# Patient Record
Sex: Female | Born: 1970 | Smoking: Current every day smoker
Health system: Southern US, Community
[De-identification: ages and names within clinical notes are randomized; demographics above are authoritative.]

## PROBLEM LIST (undated history)

## (undated) DIAGNOSIS — C801 Malignant (primary) neoplasm, unspecified: Secondary | ICD-10-CM

## (undated) DIAGNOSIS — R011 Cardiac murmur, unspecified: Secondary | ICD-10-CM

## (undated) HISTORY — PX: BREAST ENHANCEMENT SURGERY: SHX7

---

## 2010-08-16 ENCOUNTER — Other Ambulatory Visit (HOSPITAL_COMMUNITY)
Admission: RE | Admit: 2010-08-16 | Discharge: 2010-08-16 | Disposition: A | Discharge status: Home / Self Care | Payer: BC Managed Care – PPO | Source: Ambulatory Visit | Attending: Family Medicine | Admitting: Family Medicine

## 2010-08-16 DIAGNOSIS — Z1159 Encounter for screening for other viral diseases: Secondary | ICD-10-CM | POA: Insufficient documentation

## 2010-08-16 DIAGNOSIS — Z124 Encounter for screening for malignant neoplasm of cervix: Secondary | ICD-10-CM | POA: Insufficient documentation

## 2012-02-17 ENCOUNTER — Ambulatory Visit (INDEPENDENT_AMBULATORY_CARE_PROVIDER_SITE_OTHER): Payer: Self-pay | Admitting: General Surgery

## 2012-03-23 ENCOUNTER — Ambulatory Visit (INDEPENDENT_AMBULATORY_CARE_PROVIDER_SITE_OTHER): Payer: BC Managed Care – PPO | Admitting: General Surgery

## 2012-03-25 ENCOUNTER — Encounter (INDEPENDENT_AMBULATORY_CARE_PROVIDER_SITE_OTHER): Payer: Self-pay | Admitting: General Surgery

## 2015-11-28 ENCOUNTER — Other Ambulatory Visit: Payer: Self-pay | Admitting: Internal Medicine

## 2015-11-28 DIAGNOSIS — N63 Unspecified lump in unspecified breast: Secondary | ICD-10-CM

## 2015-12-03 ENCOUNTER — Other Ambulatory Visit: Payer: Self-pay | Admitting: Internal Medicine

## 2015-12-03 ENCOUNTER — Other Ambulatory Visit: Payer: Self-pay

## 2015-12-03 ENCOUNTER — Ambulatory Visit
Admission: RE | Admit: 2015-12-03 | Discharge: 2015-12-03 | Disposition: A | Discharge status: Home / Self Care | Payer: 59 | Source: Ambulatory Visit | Attending: Internal Medicine | Admitting: Internal Medicine

## 2015-12-03 DIAGNOSIS — N63 Unspecified lump in unspecified breast: Secondary | ICD-10-CM

## 2015-12-04 ENCOUNTER — Other Ambulatory Visit: Payer: Self-pay | Admitting: Internal Medicine

## 2015-12-04 DIAGNOSIS — N63 Unspecified lump in unspecified breast: Secondary | ICD-10-CM

## 2015-12-05 ENCOUNTER — Ambulatory Visit
Admission: RE | Admit: 2015-12-05 | Discharge: 2015-12-05 | Disposition: A | Discharge status: Home / Self Care | Payer: 59 | Source: Ambulatory Visit | Attending: Internal Medicine | Admitting: Internal Medicine

## 2015-12-05 ENCOUNTER — Other Ambulatory Visit: Payer: Self-pay | Admitting: Internal Medicine

## 2015-12-05 DIAGNOSIS — N63 Unspecified lump in unspecified breast: Secondary | ICD-10-CM

## 2015-12-19 ENCOUNTER — Other Ambulatory Visit: Payer: Self-pay | Admitting: General Surgery

## 2015-12-19 DIAGNOSIS — C50212 Malignant neoplasm of upper-inner quadrant of left female breast: Secondary | ICD-10-CM

## 2015-12-19 DIAGNOSIS — Z170 Estrogen receptor positive status [ER+]: Principal | ICD-10-CM

## 2015-12-27 ENCOUNTER — Ambulatory Visit
Admission: RE | Admit: 2015-12-27 | Discharge: 2015-12-27 | Disposition: A | Discharge status: Home / Self Care | Payer: 59 | Source: Ambulatory Visit | Attending: General Surgery | Admitting: General Surgery

## 2015-12-27 ENCOUNTER — Encounter: Payer: Self-pay | Admitting: Radiology

## 2015-12-27 DIAGNOSIS — C50212 Malignant neoplasm of upper-inner quadrant of left female breast: Secondary | ICD-10-CM

## 2015-12-27 MED ORDER — GADOBENATE DIMEGLUMINE 529 MG/ML IV SOLN
9.0000 mL | Freq: Once | INTRAVENOUS | Status: AC | PRN
  Administered 2015-12-27: 9 mL via INTRAVENOUS

## 2015-12-31 ENCOUNTER — Other Ambulatory Visit: Payer: Self-pay | Admitting: General Surgery

## 2015-12-31 DIAGNOSIS — C50212 Malignant neoplasm of upper-inner quadrant of left female breast: Secondary | ICD-10-CM

## 2015-12-31 DIAGNOSIS — Z170 Estrogen receptor positive status [ER+]: Principal | ICD-10-CM

## 2016-01-01 ENCOUNTER — Encounter (HOSPITAL_BASED_OUTPATIENT_CLINIC_OR_DEPARTMENT_OTHER): Payer: Self-pay | Admitting: *Deleted

## 2016-01-04 ENCOUNTER — Other Ambulatory Visit: Payer: Self-pay | Admitting: General Surgery

## 2016-01-04 ENCOUNTER — Ambulatory Visit
Admission: RE | Admit: 2016-01-04 | Discharge: 2016-01-04 | Disposition: A | Discharge status: Home / Self Care | Payer: 59 | Source: Ambulatory Visit | Attending: General Surgery | Admitting: General Surgery

## 2016-01-04 DIAGNOSIS — Z170 Estrogen receptor positive status [ER+]: Principal | ICD-10-CM

## 2016-01-04 DIAGNOSIS — C50212 Malignant neoplasm of upper-inner quadrant of left female breast: Secondary | ICD-10-CM

## 2016-01-04 NOTE — Progress Notes (Signed)
Pt given 8 oz carton of boost breeze with verbal and written instructions to drink by  5 am   Morning of surgery. Teach back and pt voiced understanding

## 2016-01-08 ENCOUNTER — Ambulatory Visit
Admission: RE | Admit: 2016-01-08 | Discharge: 2016-01-08 | Disposition: A | Discharge status: Home / Self Care | Payer: 59 | Source: Ambulatory Visit | Attending: General Surgery | Admitting: General Surgery

## 2016-01-08 ENCOUNTER — Ambulatory Visit (HOSPITAL_BASED_OUTPATIENT_CLINIC_OR_DEPARTMENT_OTHER): Payer: 59 | Admitting: Certified Registered Nurse Anesthetist

## 2016-01-08 ENCOUNTER — Ambulatory Visit (HOSPITAL_BASED_OUTPATIENT_CLINIC_OR_DEPARTMENT_OTHER)
Admission: RE | Admit: 2016-01-08 | Discharge: 2016-01-08 | Disposition: A | Discharge status: Home / Self Care | Payer: 59 | Source: Ambulatory Visit | Attending: General Surgery | Admitting: General Surgery

## 2016-01-08 ENCOUNTER — Ambulatory Visit (HOSPITAL_COMMUNITY)
Admission: RE | Admit: 2016-01-08 | Discharge: 2016-01-08 | Disposition: A | Discharge status: Home / Self Care | Payer: 59 | Source: Ambulatory Visit | Attending: General Surgery | Admitting: General Surgery

## 2016-01-08 ENCOUNTER — Encounter (HOSPITAL_BASED_OUTPATIENT_CLINIC_OR_DEPARTMENT_OTHER): Payer: Self-pay | Admitting: Certified Registered Nurse Anesthetist

## 2016-01-08 ENCOUNTER — Encounter (HOSPITAL_BASED_OUTPATIENT_CLINIC_OR_DEPARTMENT_OTHER)
Admission: RE | Disposition: A | Discharge status: Home / Self Care | Payer: Self-pay | Source: Ambulatory Visit | Attending: General Surgery

## 2016-01-08 DIAGNOSIS — C50212 Malignant neoplasm of upper-inner quadrant of left female breast: Secondary | ICD-10-CM

## 2016-01-08 DIAGNOSIS — F17200 Nicotine dependence, unspecified, uncomplicated: Secondary | ICD-10-CM | POA: Insufficient documentation

## 2016-01-08 DIAGNOSIS — N6012 Diffuse cystic mastopathy of left breast: Secondary | ICD-10-CM | POA: Insufficient documentation

## 2016-01-08 DIAGNOSIS — Z170 Estrogen receptor positive status [ER+]: Principal | ICD-10-CM

## 2016-01-08 HISTORY — DX: Malignant (primary) neoplasm, unspecified: C80.1

## 2016-01-08 HISTORY — DX: Cardiac murmur, unspecified: R01.1

## 2016-01-08 HISTORY — PX: BREAST LUMPECTOMY WITH RADIOACTIVE SEED AND SENTINEL LYMPH NODE BIOPSY: SHX6550

## 2016-01-08 SURGERY — BREAST LUMPECTOMY WITH RADIOACTIVE SEED AND SENTINEL LYMPH NODE BIOPSY
Anesthesia: General | Site: Breast | Laterality: Left

## 2016-01-08 MED ORDER — CEFAZOLIN SODIUM-DEXTROSE 2-4 GM/100ML-% IV SOLN
INTRAVENOUS | Status: AC
  Filled 2016-01-08: qty 100

## 2016-01-08 MED ORDER — ACETAMINOPHEN 500 MG PO TABS
1000.0000 mg | ORAL_TABLET | ORAL | Status: AC
  Administered 2016-01-08: 1000 mg via ORAL

## 2016-01-08 MED ORDER — FENTANYL CITRATE (PF) 100 MCG/2ML IJ SOLN: 25.0000 ug | INTRAMUSCULAR | Status: DC | PRN

## 2016-01-08 MED ORDER — PROPOFOL 10 MG/ML IV BOLUS
INTRAVENOUS | Status: DC | PRN
  Administered 2016-01-08: 150 mg via INTRAVENOUS

## 2016-01-08 MED ORDER — LIDOCAINE HCL (CARDIAC) 20 MG/ML IV SOLN
INTRAVENOUS | Status: DC | PRN
  Administered 2016-01-08: 30 mg via INTRAVENOUS

## 2016-01-08 MED ORDER — KETOROLAC TROMETHAMINE 30 MG/ML IJ SOLN
INTRAMUSCULAR | Status: DC | PRN
  Administered 2016-01-08: 30 mg via INTRAVENOUS

## 2016-01-08 MED ORDER — ONDANSETRON HCL 4 MG/2ML IJ SOLN
INTRAMUSCULAR | Status: DC | PRN
  Administered 2016-01-08: 4 mg via INTRAVENOUS

## 2016-01-08 MED ORDER — CELECOXIB 200 MG PO CAPS
ORAL_CAPSULE | ORAL | Status: AC
  Filled 2016-01-08: qty 1

## 2016-01-08 MED ORDER — KETOROLAC TROMETHAMINE 30 MG/ML IJ SOLN
INTRAMUSCULAR | Status: AC
  Filled 2016-01-08: qty 1

## 2016-01-08 MED ORDER — DEXAMETHASONE SODIUM PHOSPHATE 10 MG/ML IJ SOLN
INTRAMUSCULAR | Status: DC | PRN
  Administered 2016-01-08: 4 mg via INTRAVENOUS

## 2016-01-08 MED ORDER — MIDAZOLAM HCL 2 MG/2ML IJ SOLN
INTRAMUSCULAR | Status: AC
  Filled 2016-01-08: qty 2

## 2016-01-08 MED ORDER — TECHNETIUM TC 99M SULFUR COLLOID FILTERED
1.0000 | Freq: Once | INTRAVENOUS | Status: AC | PRN
  Administered 2016-01-08: 1 via INTRADERMAL

## 2016-01-08 MED ORDER — GABAPENTIN 300 MG PO CAPS
ORAL_CAPSULE | ORAL | Status: AC
  Filled 2016-01-08: qty 1

## 2016-01-08 MED ORDER — ACETAMINOPHEN 500 MG PO TABS
ORAL_TABLET | ORAL | Status: AC
  Filled 2016-01-08: qty 2

## 2016-01-08 MED ORDER — SODIUM CHLORIDE 0.9 % IJ SOLN
INTRAMUSCULAR | Status: AC
  Filled 2016-01-08: qty 10

## 2016-01-08 MED ORDER — FENTANYL CITRATE (PF) 100 MCG/2ML IJ SOLN
INTRAMUSCULAR | Status: AC
  Filled 2016-01-08: qty 2

## 2016-01-08 MED ORDER — CELECOXIB 200 MG PO CAPS
200.0000 mg | ORAL_CAPSULE | ORAL | Status: AC
  Administered 2016-01-08: 200 mg via ORAL

## 2016-01-08 MED ORDER — DEXAMETHASONE SODIUM PHOSPHATE 10 MG/ML IJ SOLN
INTRAMUSCULAR | Status: AC
  Filled 2016-01-08: qty 1

## 2016-01-08 MED ORDER — SCOPOLAMINE 1 MG/3DAYS TD PT72: 1.0000 | MEDICATED_PATCH | Freq: Once | TRANSDERMAL | Status: DC | PRN

## 2016-01-08 MED ORDER — METHYLENE BLUE 0.5 % INJ SOLN
INTRAVENOUS | Status: AC
  Filled 2016-01-08: qty 10

## 2016-01-08 MED ORDER — BUPIVACAINE HCL (PF) 0.25 % IJ SOLN
INTRAMUSCULAR | Status: AC
  Filled 2016-01-08: qty 30

## 2016-01-08 MED ORDER — EPHEDRINE SULFATE 50 MG/ML IJ SOLN
INTRAMUSCULAR | Status: DC | PRN
  Administered 2016-01-08: 10 mg via INTRAVENOUS

## 2016-01-08 MED ORDER — OXYCODONE-ACETAMINOPHEN 10-325 MG PO TABS: 1.0000 | ORAL_TABLET | Freq: Four times a day (QID) | ORAL | 0 refills | Status: AC | PRN

## 2016-01-08 MED ORDER — FENTANYL CITRATE (PF) 100 MCG/2ML IJ SOLN
50.0000 ug | INTRAMUSCULAR | Status: DC | PRN
  Administered 2016-01-08: 25 ug via INTRAVENOUS
  Administered 2016-01-08: 100 ug via INTRAVENOUS

## 2016-01-08 MED ORDER — LACTATED RINGERS IV SOLN
INTRAVENOUS | Status: DC
  Administered 2016-01-08 (×4): via INTRAVENOUS

## 2016-01-08 MED ORDER — PROPOFOL 10 MG/ML IV BOLUS
INTRAVENOUS | Status: AC
  Filled 2016-01-08: qty 20

## 2016-01-08 MED ORDER — LIDOCAINE 2% (20 MG/ML) 5 ML SYRINGE
INTRAMUSCULAR | Status: AC
  Filled 2016-01-08: qty 5

## 2016-01-08 MED ORDER — ONDANSETRON HCL 4 MG/2ML IJ SOLN
INTRAMUSCULAR | Status: AC
  Filled 2016-01-08: qty 2

## 2016-01-08 MED ORDER — BUPIVACAINE HCL (PF) 0.25 % IJ SOLN
INTRAMUSCULAR | Status: DC | PRN
  Administered 2016-01-08: 12 mL

## 2016-01-08 MED ORDER — GABAPENTIN 300 MG PO CAPS
300.0000 mg | ORAL_CAPSULE | ORAL | Status: AC
  Administered 2016-01-08: 300 mg via ORAL

## 2016-01-08 MED ORDER — MIDAZOLAM HCL 2 MG/2ML IJ SOLN
1.0000 mg | INTRAMUSCULAR | Status: DC | PRN
  Administered 2016-01-08: 2 mg via INTRAVENOUS

## 2016-01-08 MED ORDER — CEFAZOLIN SODIUM-DEXTROSE 2-4 GM/100ML-% IV SOLN
2.0000 g | INTRAVENOUS | Status: AC
  Administered 2016-01-08: 2 g via INTRAVENOUS

## 2016-01-08 SURGICAL SUPPLY — 56 items
BINDER BREAST LRG (GAUZE/BANDAGES/DRESSINGS) IMPLANT
BINDER BREAST MEDIUM (GAUZE/BANDAGES/DRESSINGS) IMPLANT
BINDER BREAST XLRG (GAUZE/BANDAGES/DRESSINGS) IMPLANT
BINDER BREAST XXLRG (GAUZE/BANDAGES/DRESSINGS) IMPLANT
BLADE SURG 15 STRL LF DISP TIS (BLADE) ×1 IMPLANT
BLADE SURG 15 STRL SS (BLADE) ×2
CANISTER SUC SOCK COL 7IN (MISCELLANEOUS) IMPLANT
CANISTER SUCT 1200ML W/VALVE (MISCELLANEOUS) IMPLANT
CHLORAPREP W/TINT 26ML (MISCELLANEOUS) ×3 IMPLANT
CLIP TI WIDE RED SMALL 6 (CLIP) ×3 IMPLANT
CLOSURE WOUND 1/2 X4 (GAUZE/BANDAGES/DRESSINGS) ×1
COVER BACK TABLE 60X90IN (DRAPES) ×3 IMPLANT
COVER MAYO STAND STRL (DRAPES) ×3 IMPLANT
COVER PROBE W GEL 5X96 (DRAPES) ×3 IMPLANT
DECANTER SPIKE VIAL GLASS SM (MISCELLANEOUS) IMPLANT
DERMABOND ADVANCED (GAUZE/BANDAGES/DRESSINGS) ×2
DERMABOND ADVANCED .7 DNX12 (GAUZE/BANDAGES/DRESSINGS) ×1 IMPLANT
DEVICE DUBIN W/COMP PLATE 8390 (MISCELLANEOUS) ×3 IMPLANT
DRAPE LAPAROSCOPIC ABDOMINAL (DRAPES) ×3 IMPLANT
DRAPE UTILITY XL STRL (DRAPES) ×3 IMPLANT
ELECT COATED BLADE 2.86 ST (ELECTRODE) ×3 IMPLANT
ELECT REM PT RETURN 9FT ADLT (ELECTROSURGICAL) ×3
ELECTRODE REM PT RTRN 9FT ADLT (ELECTROSURGICAL) ×1 IMPLANT
GLOVE BIO SURGEON STRL SZ7 (GLOVE) ×6 IMPLANT
GLOVE BIOGEL PI IND STRL 7.5 (GLOVE) ×1 IMPLANT
GLOVE BIOGEL PI INDICATOR 7.5 (GLOVE) ×2
GOWN STRL REUS W/ TWL LRG LVL3 (GOWN DISPOSABLE) ×2 IMPLANT
GOWN STRL REUS W/TWL LRG LVL3 (GOWN DISPOSABLE) ×4
HEMOSTAT ARISTA ABSORB 3G PWDR (MISCELLANEOUS) ×3 IMPLANT
ILLUMINATOR WAVEGUIDE N/F (MISCELLANEOUS) ×3 IMPLANT
KIT MARKER MARGIN INK (KITS) ×3 IMPLANT
LIGHT WAVEGUIDE WIDE FLAT (MISCELLANEOUS) IMPLANT
NDL SAFETY ECLIPSE 18X1.5 (NEEDLE) IMPLANT
NEEDLE HYPO 18GX1.5 SHARP (NEEDLE)
NEEDLE HYPO 25X1 1.5 SAFETY (NEEDLE) ×3 IMPLANT
NS IRRIG 1000ML POUR BTL (IV SOLUTION) IMPLANT
PACK BASIN DAY SURGERY FS (CUSTOM PROCEDURE TRAY) ×3 IMPLANT
PENCIL BUTTON HOLSTER BLD 10FT (ELECTRODE) ×3 IMPLANT
SLEEVE SCD COMPRESS KNEE MED (MISCELLANEOUS) ×3 IMPLANT
SPONGE LAP 4X18 X RAY DECT (DISPOSABLE) ×3 IMPLANT
STRIP CLOSURE SKIN 1/2X4 (GAUZE/BANDAGES/DRESSINGS) ×2 IMPLANT
SUT ETHILON 2 0 FS 18 (SUTURE) ×6 IMPLANT
SUT MNCRL AB 4-0 PS2 18 (SUTURE) ×3 IMPLANT
SUT MON AB 5-0 PS2 18 (SUTURE) IMPLANT
SUT SILK 2 0 SH (SUTURE) IMPLANT
SUT VIC AB 2-0 SH 27 (SUTURE) ×2
SUT VIC AB 2-0 SH 27XBRD (SUTURE) ×1 IMPLANT
SUT VIC AB 3-0 SH 27 (SUTURE) ×2
SUT VIC AB 3-0 SH 27X BRD (SUTURE) ×1 IMPLANT
SUT VIC AB 5-0 PS2 18 (SUTURE) IMPLANT
SYR CONTROL 10ML LL (SYRINGE) ×3 IMPLANT
TOWEL OR 17X24 6PK STRL BLUE (TOWEL DISPOSABLE) ×3 IMPLANT
TOWEL OR NON WOVEN STRL DISP B (DISPOSABLE) ×3 IMPLANT
TUBE CONNECTING 20'X1/4 (TUBING)
TUBE CONNECTING 20X1/4 (TUBING) IMPLANT
YANKAUER SUCT BULB TIP NO VENT (SUCTIONS) IMPLANT

## 2016-01-08 NOTE — Anesthesia Procedure Notes (Signed)
Procedure Name: LMA Insertion Performed by: Bufford Spikes Pre-anesthesia Checklist: Patient identified, Emergency Drugs available, Suction available and Patient being monitored Patient Re-evaluated:Patient Re-evaluated prior to inductionOxygen Delivery Method: Circle system utilized Preoxygenation: Pre-oxygenation with 100% oxygen Intubation Type: IV induction Ventilation: Mask ventilation without difficulty LMA: LMA inserted LMA Size: 4.0 Tube type: Oral Tube size: 4.0 mm Number of attempts: 1 Airway Equipment and Method: Bite block Placement Confirmation: positive ETCO2 and breath sounds checked- equal and bilateral Tube secured with: Tape Dental Injury: Teeth and Oropharynx as per pre-operative assessment

## 2016-01-08 NOTE — Interval H&P Note (Signed)
History and Physical Interval Note:  01/08/2016 10:24 AM  Bailey Henderson  has presented today for surgery, with the diagnosis of LEFT BREAST CANCER  The various methods of treatment have been discussed with the patient and family. After consideration of risks, benefits and other options for treatment, the patient has consented to  Procedure(s): BREAST LUMPECTOMY WITH RADIOACTIVE SEED AND SENTINEL LYMPH NODE BIOPSY (Left) as a surgical intervention .  The patient's history has been reviewed, patient examined, no change in status, stable for surgery.  I have reviewed the patient's chart and labs.  Questions were answered to the patient's satisfaction.     Saoirse Legere

## 2016-01-08 NOTE — Transfer of Care (Signed)
Immediate Anesthesia Transfer of Care Note  Patient: Bailey Henderson  Procedure(s) Performed: Procedure(s) with comments: BREAST LUMPECTOMY WITH RADIOACTIVE SEED AND SENTINEL LYMPH NODE BIOPSY (Left) - BREAST LUMPECTOMY WITH RADIOACTIVE SEED AND SENTINEL LYMPH NODE BIOPSY  Patient Location: PACU  Anesthesia Type:General  Level of Consciousness: awake and alert   Airway & Oxygen Therapy: Patient Spontanous Breathing and Patient connected to face mask oxygen  Post-op Assessment: Report given to RN and Post -op Vital signs reviewed and stable  Post vital signs: Reviewed and stable  Last Vitals:  Vitals:   01/08/16 1012 01/08/16 1013  BP:    Pulse: 74 74  Resp: 12 11  Temp:      Last Pain:  Vitals:   01/08/16 0924  TempSrc: Oral      Patients Stated Pain Goal: 0 ([id number])  Complications: No apparent anesthesia complications

## 2016-01-08 NOTE — Progress Notes (Signed)
Emotional support during breast injections °

## 2016-01-08 NOTE — H&P (Signed)
45 yof who is seen in consultation from Dr Marcy Panning for newly diagnosed left breast cancer. she has prior (2 years) retropectoral implants. she has no fh. she has no real prior history of breast disease. she palpated a mass in the left breast for about one week. a mm shows density c breasts. on mm this is a 1.5 cm. on [place] there is a 1.1x1x0.6cm and a node with some focal cortical thickening. 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 the node was biopsied and was benign node. she is here to discuss options. she has no nipple discharge.  Other Problems Elbert Ewings, CMA; 12/19/2015 10:27 AM) No pertinent past medical history  Past Surgical History Elbert Ewings, [place]; 12/19/2015 10:27 AM) Breast Biopsy Bilateral.  Diagnostic Studies History Elbert Ewings, CMA; 12/19/2015 10:27 AM) Colonoscopy never Mammogram within last year Pap Smear 1-5 years ago  Allergies Elbert Ewings, [place]; 12/19/2015 10:27 AM) No Known Drug Allergies10/25/2017  Medication History Elbert Ewings, CMA; 12/19/2015 10:27 AM) No Current Medications Medications Reconciled  Social History Elbert Ewings, CMA; 12/19/2015 10:27 AM) No alcohol use No drug use Tobacco use Current some day smoker.  Family History Elbert Ewings, [place]; 12/19/2015 10:27 AM) Diabetes Mellitus Brother, Father. Heart Disease Father. Hypertension Father.  Pregnancy / Birth History Elbert Ewings, CMA; 12/19/2015 10:27 AM) Age at menarche 35 years. Gravida 4 Length (months) of breastfeeding 3-6 Maternal age 87-30 Para 3 Regular periods  Review of Systems Elbert Ewings CMA; 12/19/2015 10:27 AM) General Not Present- Appetite Loss, Chills, Fatigue, Fever, Night Sweats, Weight Gain and Weight Loss. Skin Not Present- Change in Wart/Mole, Dryness, Hives, Jaundice, New Lesions, Non-Healing Wounds, Rash and Ulcer. HEENT Not Present- Earache, Hearing Loss, Hoarseness, Nose Bleed, Oral  Ulcers, Ringing in the Ears, Seasonal Allergies, Sinus Pain, Sore Throat, Visual Disturbances, Wears glasses/contact lenses and Yellow Eyes. Respiratory Not Present- Bloody sputum, Chronic Cough, Difficulty Breathing, Snoring and Wheezing. Breast Present- Breast Mass. Not Present- Breast Pain, Nipple Discharge and Skin Changes. Cardiovascular Not Present- Chest Pain, Difficulty Breathing Lying Down, Leg Cramps, Palpitations, Rapid Heart Rate, Shortness of Breath and Swelling of Extremities. Gastrointestinal Not Present- Abdominal Pain, Bloating, Bloody Stool, Change in Bowel Habits, Chronic diarrhea, Constipation, Difficulty Swallowing, Excessive gas, Gets full quickly at meals, Hemorrhoids, Indigestion, Nausea, Rectal Pain and Vomiting. Female Genitourinary Not Present- Frequency, Nocturia, Painful Urination, Pelvic Pain and Urgency. Musculoskeletal Not Present- Back Pain, Joint Pain, Joint Stiffness, Muscle Pain, Muscle Weakness and Swelling of Extremities. Neurological Not Present- Decreased Memory, Fainting, Headaches, Numbness, Seizures, Tingling, Tremor, Trouble walking and Weakness. Psychiatric Not Present- Anxiety, Bipolar, Change in Sleep Pattern, Depression, Fearful and Frequent crying. Endocrine Not Present- Cold Intolerance, Excessive Hunger, Hair Changes, Heat Intolerance, Hot flashes and New Diabetes. Hematology Not Present- Blood Thinners, Easy Bruising, Excessive bleeding, Gland problems, HIV and Persistent Infections.  Vitals Elbert Ewings CMA; 12/19/2015 10:28 AM) 12/19/2015 10:27 AM Weight: 111 lb Height: 59in Body Surface Area: 1.44 m Body Mass Index: 22.42 kg/m  Temp.: 97.33F(Temporal)  Pulse: 94 (Regular)  BP: 120/68 (Sitting, Left Arm, Standard)   Physical Exam Rolm Bookbinder MD; 12/19/2015 12:44 PM) General Mental Status-Alert. Orientation-Oriented X3.  Chest and Lung Exam Chest and lung exam reveals -on auscultation, normal breath sounds, no  adventitious sounds and normal vocal resonance.  Breast Nipples-No Discharge. Note: bruising in luiq, implants, bilateral im incisions healing well   Cardiovascular Cardiovascular examination reveals -normal heart sounds, regular rate and rhythm with no murmurs.  Lymphatic Head & Neck  General Head & Neck Lymphatics: Bilateral - Description - Normal. Axillary  General Axillary Region: Bilateral - Description - Normal. Note: no [place] adenopathy   Assessment & Plan Rolm Bookbinder MD; 12/19/2015 12:42 PM) BREAST CANCER OF UPPER-INNER QUADRANT OF LEFT FEMALE BREAST (C50.212) Story: Genetics pending, mri, if negative will plan left seed guided lumpectomy, left ax sn biopsy We discussed the staging and pathophysiology of breast cancer. We discussed all of the different options for treatment for breast cancer including surgery, chemotherapy, radiation therapy, Herceptin, and antiestrogen therapy. We discussed a sentinel lymph node biopsy as she does not appear to having lymph node involvement right now. We discussed the performance of that with injection of radioactive tracer. We discussed that she would have an incision underneath her axillary hairline. We discussed that there is a chance of having a positive node with a sentinel lymph node biopsy and we will await the permanent pathology to make any other first further decisions in terms of her treatment. We discussed up to a 5% risk lifetime of chronic shoulder pain as well as lymphedema associated with a sentinel lymph node biopsy. We discussed the options for treatment of the breast cancer which included lumpectomy versus a mastectomy. We discussed the performance of the lumpectomy with radioactive seed placement. We discussed a 5-10% chance of a positive margin requiring reexcision in the operating room. We also discussed that she will need radiation therapy (this is usually 5-7 weeks) if she undergoes lumpectomy. We discussed the  mastectomy (removal of whole breast) and the postoperative care for that as well. Mastectomy can be followed by reconstruction. This is a more extensive surgery and requires more recovery. The decision for lumpectomy vs mastectomy has no impact on decision for chemotherapy. Most mastectomy patients will not need radiation therapy. We discussed that there is no difference in her survival. There is also no real difference between her recurrence in the breast. We discussed the risks of operation including bleeding, infection, possible reoperation. She understands her further therapy will be based on what her stages at the time of her operation.

## 2016-01-08 NOTE — Op Note (Signed)
Preoperative diagnosis: Left breast cancer, clinical stage I Postoperative diagnosis: same as above Procedure: 1. Left breast seed guided lumpectomy 2. Left axillary sentinel nodebiopsy Surgeon Dr Serita Grammes Anes general  EBL: minimal Comps none Specimen:  1. Leftbreast marked with paint 2. Left axillary sentinel nodes with count of 2171 highest 3. Additional medial, lateral , superior and anterior marginsmarked short superior, long lateral, double deep Sponge count correct at completion Dispoto recovery stable  Indications: This is a 43 yof with negative genetic testing who has newly diagnosed left breast cancer that appears to be clinical stage I. We discussed options and have elected to proceed with left lumpectomy and sn biopsy. She had seed placed in breast lesion prior to beginning and I had these mammograms in the OR  Procedure: After informed consent was obtained the patient was taken to the OR. She was injected with technetium in the standard periareolar fashion.  She was given anitibiotics. SCDs were in place. She was prepped and draped in the standard sterile surgical fashion. A timeout was performed. I then located the seed in upper inner breast about 8 cm from areola. This was near the sternum clinically.  I then infiltrated marcaine and made a periareolar incision.I used the lighted retractors to tunnel to the lesion in an attempt to hide her scar. I then removed the seedwith an attempt to get a clear margin. This waspassed off the table after marking with paint. I did confirm removal of theclipsand seeds with mammography.I knew I was close at several margins which also showed on mm.  I did remove additional margins as above. The anterior margin is the skin, the posterior margin is the muscle and there really is not any additional breast tissue medially or superiorly. I placed clips in the cavity.  I obtained hemostasis. I then closed this with 2-0 vicryl,  3-0 vicryl and 5-0 monocryl. Glue and steristrips were applied. I then made an incision in the axilla. I went through the axillary fascia. I identified the sentinel nodes with the highest count as above. There was no background radioactivity. I obtained hemostasis. I then closed the fascia with 2-0 vicryl. I closed the skin with 3-0 vicryl and 4-0 monocryl. Glue and steristrips were placed A breast binder was placed. She was extubated and transferred to recovery stable

## 2016-01-08 NOTE — Anesthesia Preprocedure Evaluation (Addendum)
Anesthesia Evaluation  Patient identified by MRN, date of birth, ID band Patient awake    Reviewed: Allergy & Precautions, NPO status , Patient's Chart, lab work & pertinent test results  Airway Mallampati: II  TM Distance: >3 FB     Dental   Pulmonary Current Smoker,    breath sounds clear to auscultation       Cardiovascular negative cardio ROS   Rhythm:Regular Rate:Normal     Neuro/Psych    GI/Hepatic negative GI ROS, Neg liver ROS,   Endo/Other  negative endocrine ROS  Renal/GU negative Renal ROS     Musculoskeletal   Abdominal   Peds  Hematology   Anesthesia Other Findings   Reproductive/Obstetrics                             Anesthesia Physical Anesthesia Plan  ASA: II  Anesthesia Plan: General   Post-op Pain Management:    Induction: Intravenous  Airway Management Planned: LMA  Additional Equipment:   Intra-op Plan:   Post-operative Plan: Extubation in OR  Informed Consent: I have reviewed the patients History and Physical, chart, labs and discussed the procedure including the risks, benefits and alternatives for the proposed anesthesia with the patient or authorized representative who has indicated his/her understanding and acceptance.   Dental advisory given  Plan Discussed with: CRNA and Anesthesiologist  Anesthesia Plan Comments:       Anesthesia Quick Evaluation

## 2016-01-08 NOTE — Discharge Instructions (Signed)
[facility] °Office Phone Number [phone] ° °POST OP INSTRUCTIONS ° °Always review your discharge instruction sheet given to you by the facility where your surgery was performed. ° °IF YOU HAVE DISABILITY OR FAMILY LEAVE FORMS, YOU MUST BRING THEM TO THE OFFICE FOR PROCESSING.  DO NOT GIVE THEM TO YOUR DOCTOR. ° °1. A prescription for pain medication may be given to you upon discharge.  Take your pain medication as prescribed, if needed.  If narcotic pain medicine is not needed, then you may take acetaminophen (Tylenol), naprosyn (Alleve) or ibuprofen (Advil) as needed. °2. Take your usually prescribed medications unless otherwise directed °3. If you need a refill on your pain medication, please contact your pharmacy.  They will contact our office to request authorization.  Prescriptions will not be filled after 5pm or on week-ends. °4. You should eat very light the first 24 hours after surgery, such as soup, crackers, pudding, etc.  Resume your normal diet the day after surgery. °5. Most patients will experience some swelling and bruising in the breast.  Ice packs and a good support bra will help.  Wear the breast binder provided or a sports bra for 72 hours day and night.  After that wear a sports bra during the day until you return to the office. Swelling and bruising can take several days to resolve.  °6. It is common to experience some constipation if taking pain medication after surgery.  Increasing fluid intake and taking a stool softener will usually help or prevent this problem from occurring.  A mild laxative (Milk of Magnesia or Miralax) should be taken according to package directions if there are no bowel movements after 48 hours. °7. Unless discharge instructions indicate otherwise, you may remove your bandages 48 hours after surgery and you may shower at that time.  You may have steri-strips (small skin tapes) in place directly over the incision.  These strips should be left on the  skin for 7-10 days and will come off on their own.  If your surgeon used skin glue on the incision, you may shower in 24 hours.  The glue will flake off over the next 2-3 weeks.  Any sutures or staples will be removed at the office during your follow-up visit. °8. ACTIVITIES:  You may resume regular daily activities (gradually increasing) beginning the next day.  Wearing a good support bra or sports bra minimizes pain and swelling.  You may have sexual intercourse when it is comfortable. °a. You may drive when you no longer are taking prescription pain medication, you can comfortably wear a seatbelt, and you can safely maneuver your car and apply brakes. °b. RETURN TO WORK:  ______________________________________________________________________________________ °9. You should see your doctor in the office for a follow-up appointment approximately two weeks after your surgery.  Your doctor’s nurse will typically make your follow-up appointment when she calls you with your pathology report.  Expect your pathology report 3-4 business days after your surgery.  You may call to check if you do not hear from us after three days. °10. OTHER INSTRUCTIONS: _______________________________________________________________________________________________ _____________________________________________________________________________________________________________________________________ °_____________________________________________________________________________________________________________________________________ °_____________________________________________________________________________________________________________________________________ ° °WHEN TO CALL DR WAKEFIELD: °1. Fever over 101.0 °2. Nausea and/or vomiting. °3. Extreme swelling or bruising. °4. Continued bleeding from incision. °5. Increased pain, redness, or drainage from the incision. ° °The clinic staff is available to answer your questions during regular  business hours.  Please don’t hesitate to call and ask to speak to one of the nurses for clinical concerns.  If   you have a medical emergency, go to the nearest emergency room or call 911.  A surgeon from [facility] is always on call at the hospital. ° °For further questions, please visit [email or web address] ° ° ° °Post Anesthesia Home Care Instructions ° °Activity: °Get plenty of rest for the remainder of the day. A responsible adult should stay with you for 24 hours following the procedure.  °For the next 24 hours, DO NOT: °-Drive a car °-Operate machinery °-Drink alcoholic beverages °-Take any medication unless instructed by your physician °-Make any legal decisions or sign important papers. ° °Meals: °Start with liquid foods such as gelatin or soup. Progress to regular foods as tolerated. Avoid greasy, spicy, heavy foods. If nausea and/or vomiting occur, drink only clear liquids until the nausea and/or vomiting subsides. Call your physician if vomiting continues. ° °Special Instructions/Symptoms: °Your throat may feel dry or sore from the anesthesia or the breathing tube placed in your throat during surgery. If this causes discomfort, gargle with warm salt water. The discomfort should disappear within 24 hours. ° °If you had a scopolamine patch placed behind your ear for the management of post- operative nausea and/or vomiting: ° °1. The medication in the patch is effective for 72 hours, after which it should be removed.  Wrap patch in a tissue and discard in the trash. Wash hands thoroughly with soap and water. °2. You may remove the patch earlier than 72 hours if you experience unpleasant side effects which may include dry mouth, dizziness or visual disturbances. °3. Avoid touching the patch. Wash your hands with soap and water after contact with the patch. °  ° °

## 2016-01-08 NOTE — Anesthesia Postprocedure Evaluation (Signed)
Anesthesia Post Note  Patient: Bailey Henderson  Procedure(s) Performed: Procedure(s) (LRB): BREAST LUMPECTOMY WITH RADIOACTIVE SEED AND SENTINEL LYMPH NODE BIOPSY (Left)  Patient location during evaluation: PACU Anesthesia Type: General Level of consciousness: awake Pain management: pain level controlled Vital Signs Assessment: post-procedure vital signs reviewed and stable Respiratory status: spontaneous breathing Cardiovascular status: stable Anesthetic complications: no    Last Vitals:  Vitals:   01/08/16 1013 01/08/16 1200  BP:    Pulse: 74 98  Resp: 11   Temp:      Last Pain:  Vitals:   01/08/16 0924  TempSrc: Oral                 EDWARDS,Trelyn Vanderlinde

## 2016-01-09 ENCOUNTER — Encounter (HOSPITAL_BASED_OUTPATIENT_CLINIC_OR_DEPARTMENT_OTHER): Payer: Self-pay | Admitting: General Surgery

## 2016-01-10 NOTE — Addendum Note (Signed)
Addendum  created 01/10/16 [id number] by Tawni Millers, CRNA   Charge Capture section accepted

## 2016-01-18 ENCOUNTER — Emergency Department (HOSPITAL_COMMUNITY)
Admission: EM | Admit: 2016-01-18 | Discharge: 2016-01-18 | Disposition: A | Discharge status: Home / Self Care | Payer: 59 | Attending: Dermatology | Admitting: Dermatology

## 2016-01-18 ENCOUNTER — Encounter (HOSPITAL_COMMUNITY): Payer: Self-pay

## 2016-01-18 ENCOUNTER — Telehealth: Payer: Self-pay | Admitting: General Surgery

## 2016-01-18 DIAGNOSIS — Z5321 Procedure and treatment not carried out due to patient leaving prior to being seen by health care provider: Secondary | ICD-10-CM | POA: Diagnosis not present

## 2016-01-18 DIAGNOSIS — Z9889 Other specified postprocedural states: Secondary | ICD-10-CM | POA: Diagnosis not present

## 2016-01-18 DIAGNOSIS — F17200 Nicotine dependence, unspecified, uncomplicated: Secondary | ICD-10-CM | POA: Diagnosis not present

## 2016-01-18 DIAGNOSIS — N644 Mastodynia: Secondary | ICD-10-CM | POA: Insufficient documentation

## 2016-01-18 DIAGNOSIS — Z853 Personal history of malignant neoplasm of breast: Secondary | ICD-10-CM | POA: Insufficient documentation

## 2016-01-18 NOTE — ED Triage Notes (Signed)
Pt states she had breast surgery at week ago; pt states she started having breast pain and hardness today; pt denies any other symptoms; pt states pain at 5/10 on arrival. Pt a&ox 4 on arrival.

## 2016-01-18 NOTE — ED Notes (Signed)
Pt refuses to have vitals taken and has decided to leave. Pt states she does not want to speak to the triage nurse.

## 2016-01-18 NOTE — Telephone Encounter (Signed)
pt had breast lumpectomy by dr Donne Hazel. called in with pain/redness/swelling of breast site. advised pt that she prob has seroma causing redness and needs it to be aspirated and started on abx. no f/c. advised pt to come to ED. pt states she went to [facility] at 11pm last night 11/23 and left at 4am without being seen - too cold, too much pain, hadn't been evaluated. pt said she couldn't come back to ED right now til perhaps late in day bc she had her kids and no one to care for them until husband got home. we agreed that we would start abx. sent in doxy to [facility]. advised that may not be sufficient enough to get her to Monday to be evaluated in office. instructed pt on what to go to ED for. also advised tylenol and motrin.   pls call pt monday and see how she is doing. may need urgent office appt for aspiration.

## 2016-02-27 ENCOUNTER — Other Ambulatory Visit: Payer: Self-pay | Admitting: General Surgery

## 2016-02-27 DIAGNOSIS — T148XXA Other injury of unspecified body region, initial encounter: Secondary | ICD-10-CM

## 2016-02-28 ENCOUNTER — Ambulatory Visit
Admission: RE | Admit: 2016-02-28 | Discharge: 2016-02-28 | Disposition: A | Discharge status: Home / Self Care | Payer: 59 | Source: Ambulatory Visit | Attending: General Surgery | Admitting: General Surgery

## 2016-02-28 DIAGNOSIS — T148XXA Other injury of unspecified body region, initial encounter: Secondary | ICD-10-CM

## 2017-08-08 IMAGING — US US PLC BREAST LOC DEV 1ST LESION  INC US GUIDE*L*
1 series · 1 of 1 positions shown · non-contrast
Comparison: Previous exam(s).

CLINICAL DATA: Radioactive seed localization of the left breast
prior to lumpectomy.

EXAM:
ULTRASOUND GUIDED RADIOACTIVE SEED LOCALIZATION OF THE LEFT BREAST

[Series 1: us plc breast loc dev 1st lesion inc us guide*left · 0.06mm/px · 1 of 1 slices shown]
[im 1/1]
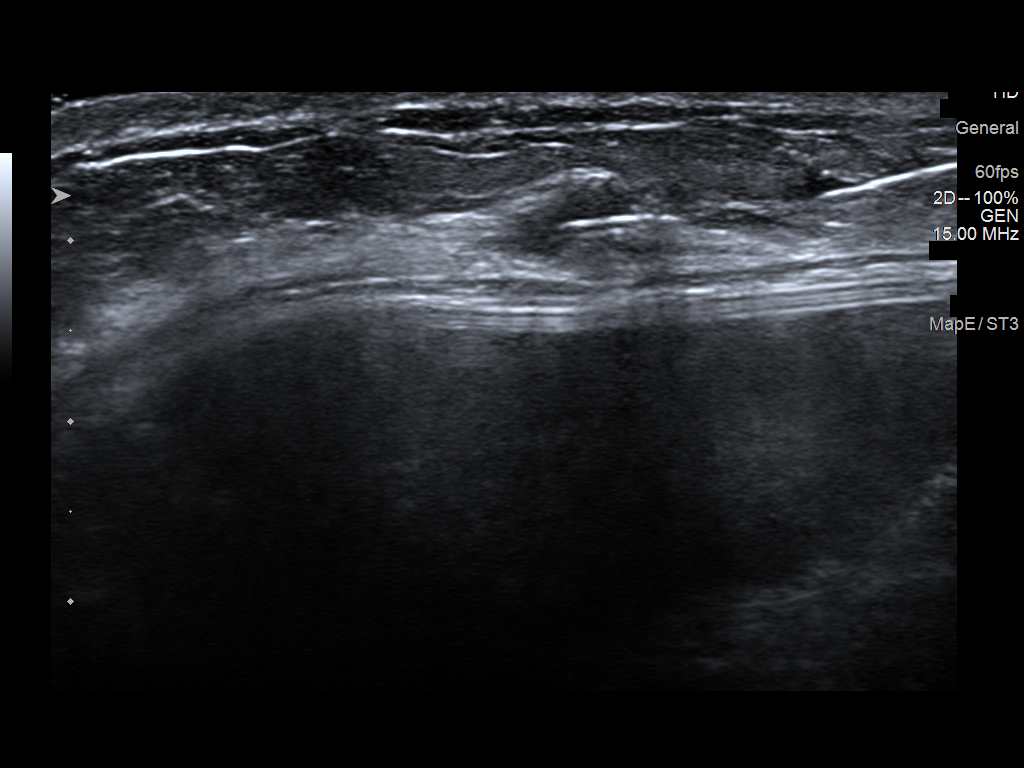

[1 of 1 positions shown; findings below may reference images not displayed]

FINDINGS: Patient presents for radioactive seed localization prior to left
breast lumpectomy. I met with the patient and we discussed the
procedure of seed localization including benefits and alternatives.
We discussed the high likelihood of a successful procedure. We
discussed the risks of the procedure including infection, bleeding,
tissue injury and further surgery. We discussed the low dose of
radioactivity involved in the procedure. Informed, written consent
was given.

The usual time-out protocol was performed immediately prior to the
procedure.

Using ultrasound guidance, sterile technique, 1% lidocaine and an
A-USC radioactive seed, the palpable mass in the upper inner left
breast was localized using an inferior approach. The follow-up
mammogram images confirm the seed in the expected location and were
marked for Dr. Dewaldt.

Follow-up survey of the patient confirms presence of the radioactive
seed.

Order number of A-USC seed:  [id number].

Total activity: 0.257 millicuries Reference Date: 12/07/2015

The patient tolerated the procedure well and was released from the
[REDACTED]. She was given instructions regarding seed removal.
IMPRESSION: Radioactive seed localization left breast. No apparent
complications.

## 2017-08-08 IMAGING — MG MM CLIP PLACEMENT
4 series · 4 of 4 positions shown · non-contrast
Comparison: Previous exam(s).

CLINICAL DATA: Post radioactive seed localization mammogram of the
left breast for seed placement.

EXAM:
DIAGNOSTIC LEFT MAMMOGRAM POST ULTRASOUND RADIOACTIVE SEED
LOCALIZATION

[L MLO (1 of 2)]
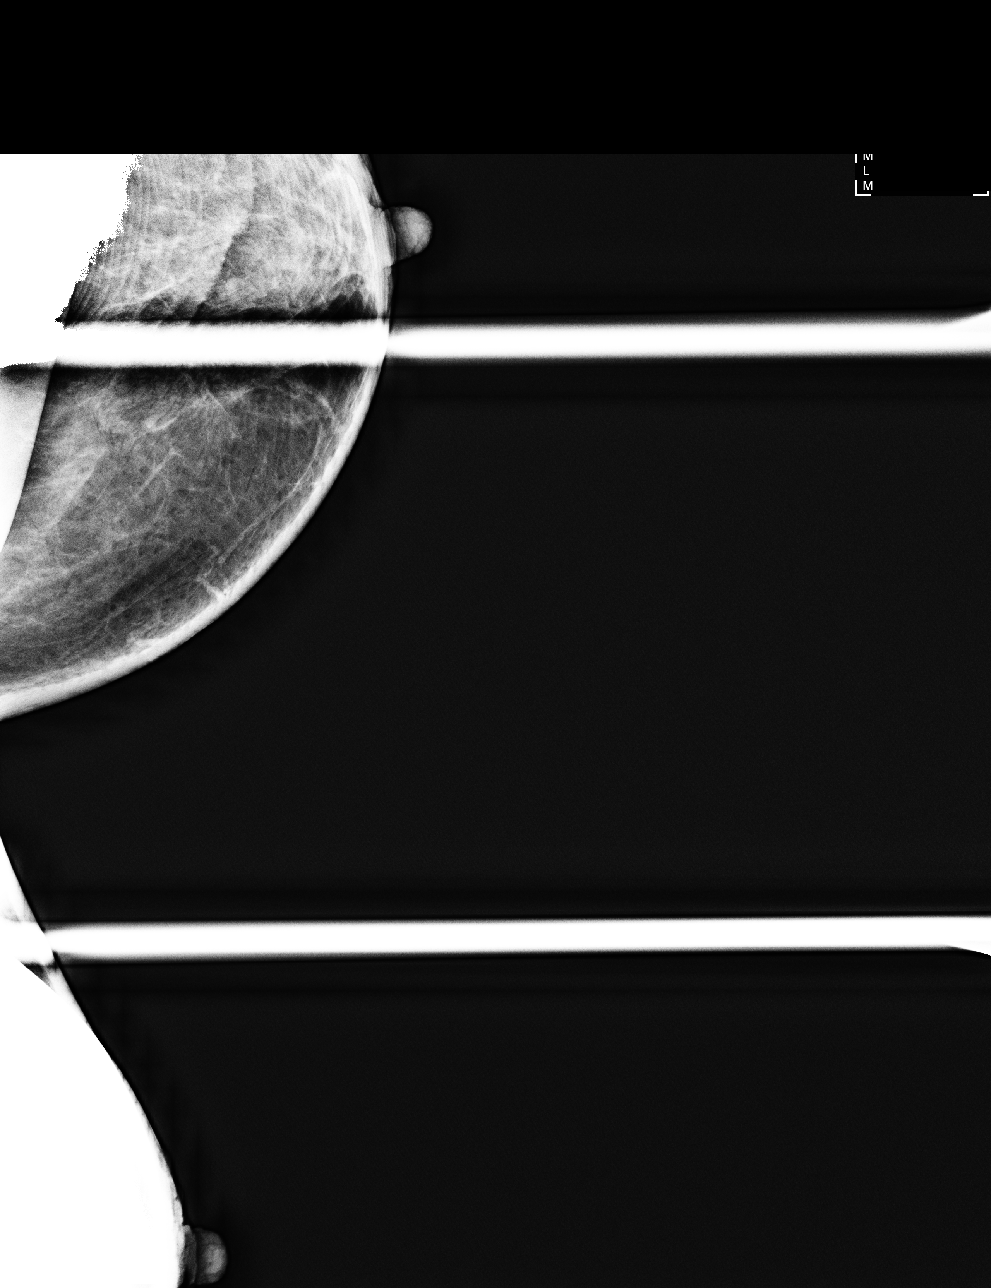

[L MLO (2 of 2)]
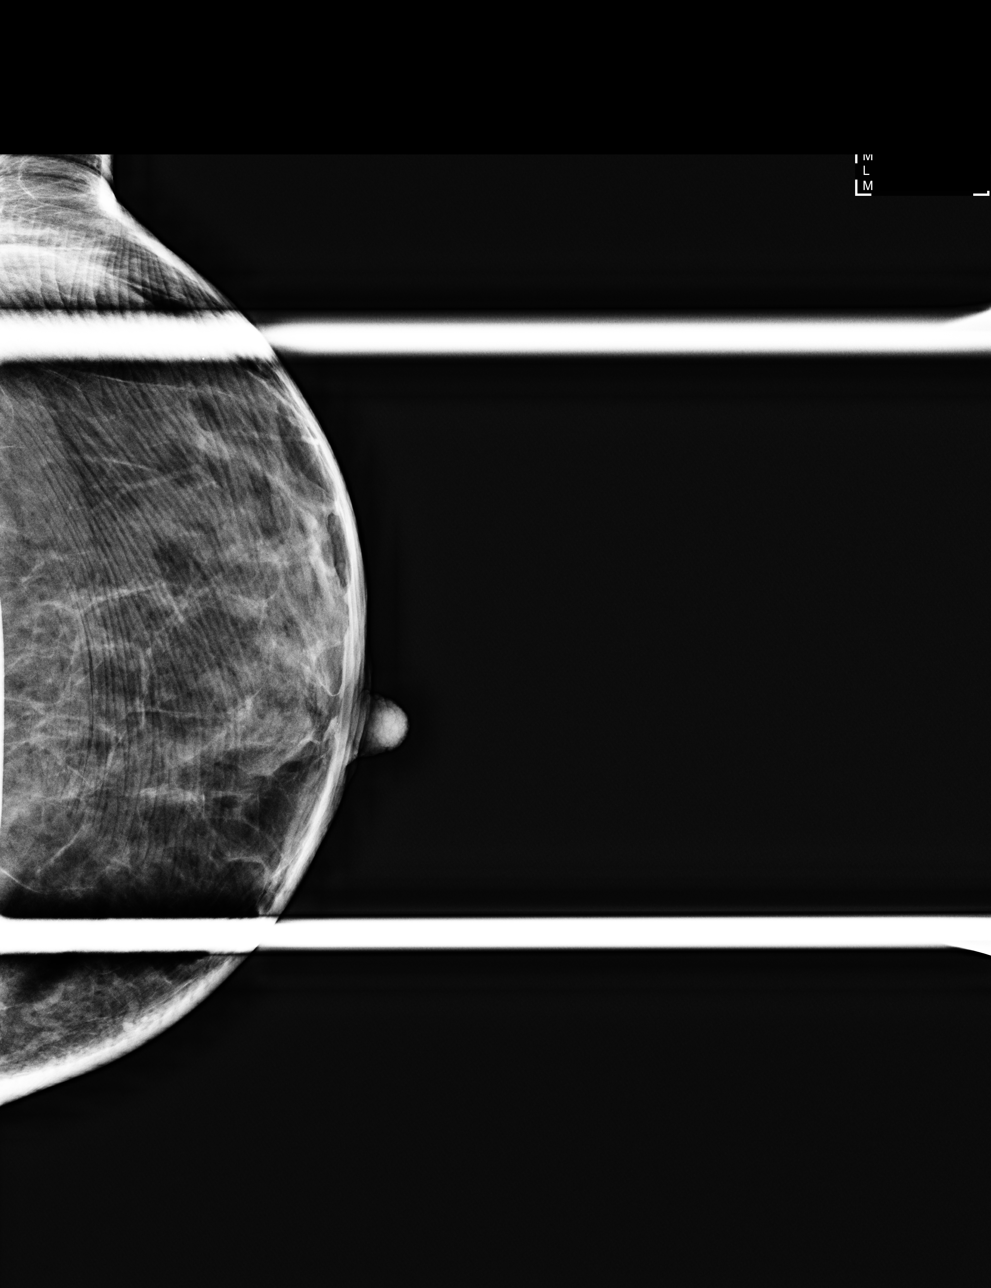

[L CC]
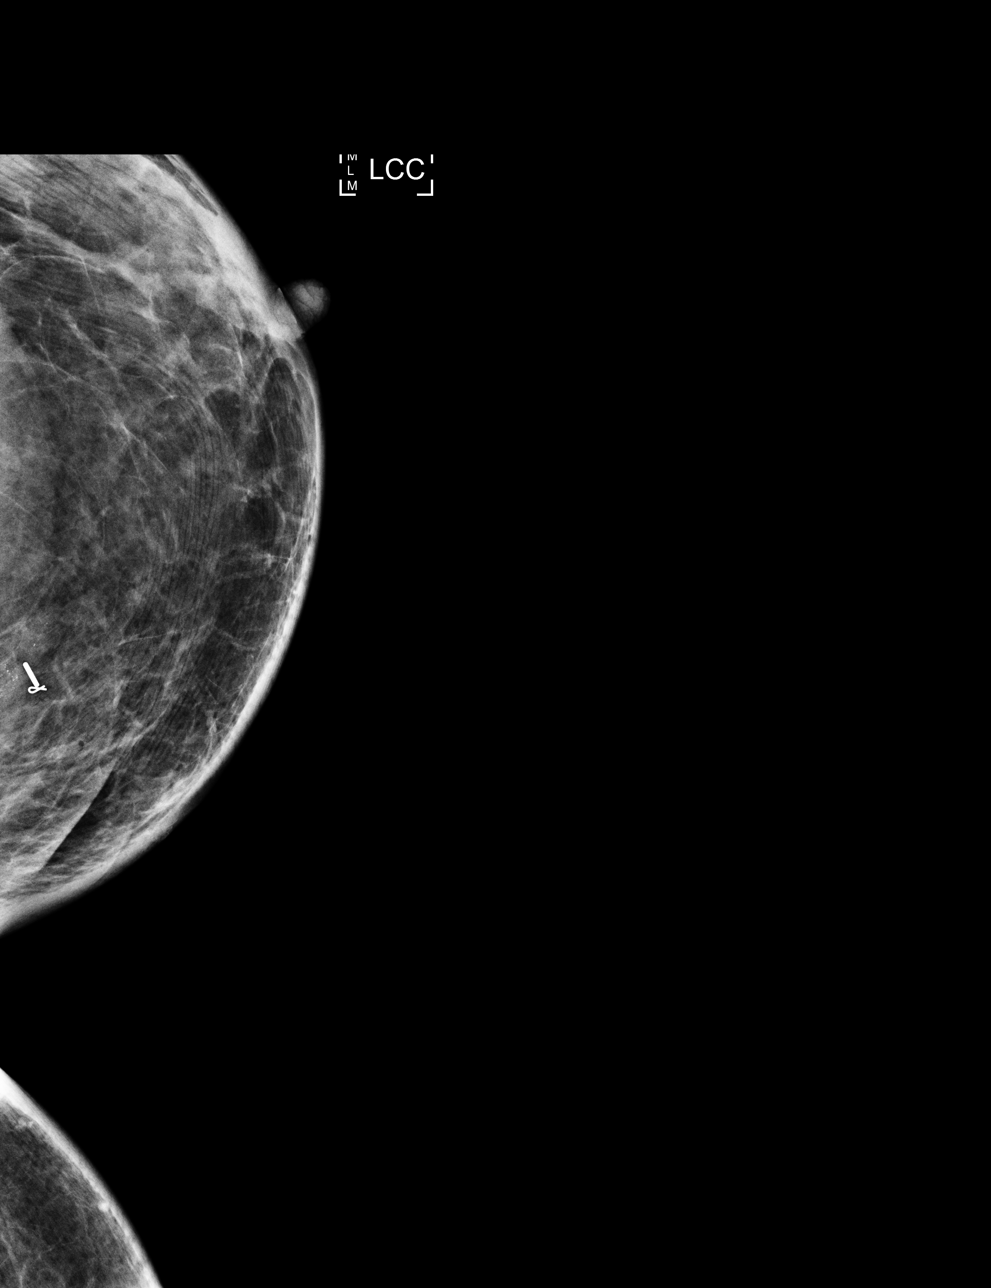

[L ML]
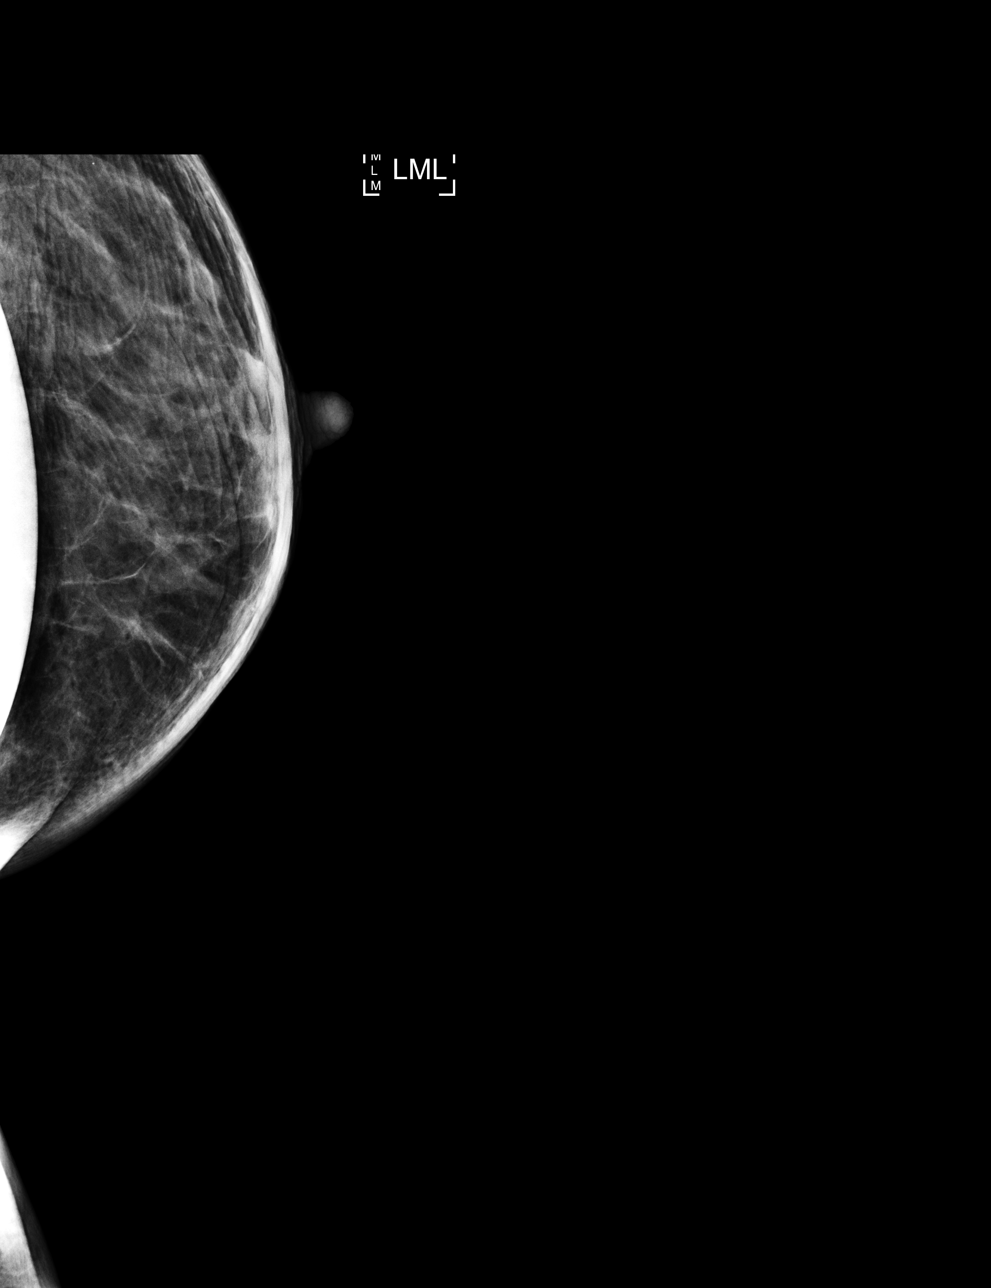

[4 of 4 positions shown; findings below may reference images not displayed]

FINDINGS: Mammographic images were obtained radioactive seed localization of a
palpable mass in the upper inner left breast. The radioactive seed
and clip are visualized only on the CC view as the mass is too far
posterior and medial to capture on the MLO view.
IMPRESSION: Single-view confirmation of appropriate radioactive seed positioning
adjacent to the biopsy marking clip in the medial left breast.

Final Assessment: Post Procedure Mammograms for Marker Placement
